# Patient Record
Sex: Female | Born: 1980 | Hispanic: Yes | Marital: Single | Smoking: Never smoker
Health system: Southern US, Community
[De-identification: ages and names within clinical notes are randomized; demographics above are authoritative.]

## PROBLEM LIST (undated history)

## (undated) DIAGNOSIS — D249 Benign neoplasm of unspecified breast: Secondary | ICD-10-CM

## (undated) DIAGNOSIS — N63 Unspecified lump in unspecified breast: Secondary | ICD-10-CM

## (undated) DIAGNOSIS — N6009 Solitary cyst of unspecified breast: Secondary | ICD-10-CM

## (undated) HISTORY — DX: Unspecified lump in unspecified breast: N63.0

## (undated) HISTORY — DX: Benign neoplasm of unspecified breast: D24.9

## (undated) HISTORY — DX: Solitary cyst of unspecified breast: N60.09

---

## 2007-03-03 ENCOUNTER — Ambulatory Visit: Payer: Self-pay | Admitting: Family Medicine

## 2007-07-31 ENCOUNTER — Inpatient Hospital Stay: Payer: Self-pay | Admitting: Obstetrics and Gynecology

## 2012-03-19 ENCOUNTER — Ambulatory Visit: Payer: Self-pay | Admitting: Family Medicine

## 2012-04-01 ENCOUNTER — Ambulatory Visit: Payer: Self-pay | Admitting: Family Medicine

## 2012-04-12 ENCOUNTER — Ambulatory Visit: Payer: Self-pay | Admitting: Family Medicine

## 2012-04-14 DIAGNOSIS — N63 Unspecified lump in unspecified breast: Secondary | ICD-10-CM

## 2012-04-14 DIAGNOSIS — D249 Benign neoplasm of unspecified breast: Secondary | ICD-10-CM

## 2012-04-14 DIAGNOSIS — N6009 Solitary cyst of unspecified breast: Secondary | ICD-10-CM

## 2012-04-14 HISTORY — DX: Solitary cyst of unspecified breast: N60.09

## 2012-04-14 HISTORY — DX: Benign neoplasm of unspecified breast: D24.9

## 2012-04-14 HISTORY — PX: BREAST BIOPSY: SHX20

## 2012-04-14 HISTORY — DX: Unspecified lump in unspecified breast: N63.0

## 2012-04-20 ENCOUNTER — Ambulatory Visit: Payer: Self-pay

## 2012-04-29 HISTORY — PX: BREAST SURGERY: SHX581

## 2012-08-24 ENCOUNTER — Encounter: Payer: Self-pay | Admitting: *Deleted

## 2012-08-24 DIAGNOSIS — D249 Benign neoplasm of unspecified breast: Secondary | ICD-10-CM | POA: Insufficient documentation

## 2012-11-16 ENCOUNTER — Ambulatory Visit (INDEPENDENT_AMBULATORY_CARE_PROVIDER_SITE_OTHER): Payer: PRIVATE HEALTH INSURANCE | Admitting: General Surgery

## 2012-11-16 ENCOUNTER — Other Ambulatory Visit: Payer: Self-pay

## 2012-11-16 ENCOUNTER — Encounter: Payer: Self-pay | Admitting: General Surgery

## 2012-11-16 VITALS — BP 98/72 | HR 72 | Resp 14 | Ht <= 58 in | Wt 131.0 lb

## 2012-11-16 DIAGNOSIS — N63 Unspecified lump in unspecified breast: Secondary | ICD-10-CM

## 2012-11-16 DIAGNOSIS — D249 Benign neoplasm of unspecified breast: Secondary | ICD-10-CM

## 2012-11-16 NOTE — Patient Instructions (Addendum)
Auto Examen [employer] (Breast Self-Examination) Deber comenzar con el examen de sus mamas a los 20 aos incluso cuando el riesgo de cncer es bajo a esa edad. Es importante familiarizarse acerca de cmo sus pechos se ven y se sienten. Esto tambin es necesario en mujeres embarazadas, en etapa de amamantamiento, con menopausia o con implantes.  Las mujeres deben examinar sus pechos una vez al mes para observar cambios y bultos. Al realizar los [employer], se llega a Microbiologist en que sus senos se sienten y [place] de un mes a Therapist, art. Esto le permite descubrir cambios de manera precoz. Este autoexamen [employer] ofrece la tranquilidad de que sus senos estn en buen [place]. Este examen slo le tomar algunos minutos y Health and safety inspector aos a su vida. La mayor parte de los bultos en la mama no son cancerosos. Si encuentra un bulto, un tipo de radiografas llamado mamografa u otras pruebas sern necesarias para determinar qu es lo que est mal.  Algunos de los sntomas de que un bulto en el pecho est causado por un cncer son:  Hundimiento de la piel o cambios en la forma del pecho o pezn.   Supuracin oscura o sanguinolenta del pezn.   Ganglios linfticos inflamados alrededor del pecho o en la axila.   Enrojecimiento de la mama o pezn.   Descamacin del pezn o la piel del pecho.   Dolor o inflamacin de la mama.  AUTOEXAMEN  Hay algunas indicaciones que debe seguir para Charity fundraiser minucioso. El mejor momento para examinar las mamas es 5 a 7 das despus que ha finalizado el perodo menstrual. Durante los aos en que se [place], es mejor examinar las mamas uno o [facility] despus del perodo menstrual. Durante la menstruacin, las mamas estn ms abultadas y puede haber ms dificultad para Clinical research associate modificaciones. Si no menstra, se encuentra en la menopausia o ha sufrido una histerectoma, Orthoptist de cada mes. Luego de tres o cuatro meses se  familiarizar con las variaciones y se sentir ms cmoda para Horticulturist, commercial.  Realice un examen mensualmente. Debe llevar un registro escrito con los cambios o los hallazgos normales que encuentre para cada seno. Mudlogger segura de los cambios y no depender slo de la memoria para el [place], las molestias o la ubicacin de bultos. Trate de Horticulturist, commercial en el mismo momento cada mes y registrar cuando tiene su perodo menstrual, si an lo tiene.   Observe sus mamas. Prese frente a un espejo con las manos tomadas detrs de la [place]. Tense los msculos del pecho y busque asimetras. Esto significa diferencias en la forma o en el contorno de un seno al otro, tales como arrugas, hoyuelos o bultos. Tambin busque cambios en la piel.   Reclnese hacia delante con sus manos sobre las caderas Una vez ms busque asimetras y cambios en la piel.   Mientras se ducha, jabnese y palpe cuidadosamente los senos con la yema de los dedos mientras sostiene el brazo (del lado del seno que es examinado) sobre la cabeza. Hgalo con cada seno cuidadosamente, buscando bultos o modificaciones. Generalmente deber emplear un movimiento circular con Lacretia N[place] presin de los dedos.   Repita este examen mientras est recostada sobre la [place], una vez ms con el brazo sobre la cabeza y una almohada bajo los hombros. [place] use las yemas de los dedos para examinar ambas mamas, buscando bultos y engrosamientos. Comience  en la zona de la hora 1 y contine en el sentido de las agujas del reloj por toda la mama.   Al terminar el examen, pellizque suavemente cada pezn para ver si hay secrecin. Observe cambios en los pezones, hundimientos o enrojecimiento.   Finalmente examine la parte superior del trax, la zona de las clavculas y [place].  No es necesario alarmarse si encuentra un bulto. La mayor parte son benignos (no cancerosos). Pero ser necesario consultar con el profesional que la  asiste para que pueda evaluarlo. Document Released: 03/31/2005 Document Revised: 12/11/2010 [facility] Patient Information 2012 [place], [place].

## 2012-11-16 NOTE — Progress Notes (Signed)
Patient ID: Christina Conrad, female   DOB: 1980-05-23, 32 y.o.   MRN: [id number]  Chief Complaint  Patient presents with  . Other    breast    HPI Christina Conrad is a 32 y.o. female. who presents for a follow up breast evaluation and ultrasound. 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 Patient does perform regular self breast checks. Occasional pain right breast prior to monthly periods. No new breast issues.  Interpreter present for exam.    HPI  Past Medical History  Diagnosis Date  . 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 Lump or mass in breast 2014  . Solitary cyst of breast 2014    Past Surgical History  Procedure Laterality Date  . Cesarean section  2009  . Breast surgery Bilateral 1.16.2014    Bard core bx done left breast 2 o'clock, right breast 1100 and 2 o'clock. fibroadenoma and PASH    Family History  Problem Relation Age of Onset  . Breast cancer Paternal Grandmother   . Breast cancer Paternal Grandmother     great grandmother    Social History History  Substance Use Topics  . Smoking status: Never Smoker   . Smokeless tobacco: Never Used  . Alcohol Use: No    Allergies  Allergen Reactions  . Shellfish Allergy Itching    Current Outpatient Prescriptions  Medication Sig Dispense Refill  . acetaminophen (TYLENOL) 500 MG tablet Take 500 mg by mouth as needed for pain.       No current facility-administered medications for this visit.    Review of Systems Review of Systems  Blood pressure 98/72, pulse 72, resp. rate 14, height 4\' 10"  (1.473 m), weight 131 lb (59.421 kg), last menstrual period 11/12/2012.  Physical Exam Physical Exam  Constitutional: She is oriented to person, place, and time. She appears well-developed and well-nourished.  Eyes: Conjunctivae are normal.  Neck: Neck supple. No thyromegaly present.  Pulmonary/Chest: Right breast exhibits  no inverted nipple, no mass, no nipple discharge, no skin change and no tenderness. Left breast exhibits no inverted nipple, no mass, no nipple discharge, no skin change and no tenderness.  Lymphadenopathy:    She has no cervical adenopathy.  Neurological: She is alert and oriented to person, place, and time.  Skin: Skin is warm and dry.    Data Reviewed Previous ultrasound.  Assessment    By ultrasound today the left breast mass and right breast mass at 2 o'clock are not seen.  The mass in the right breast at 11 o'clock appears stable.    Plan    Follow up on an as needed.  Continue self breast exams.    Kazia Grisanti G 11/16/2012, 9:09 PM

## 2013-01-13 ENCOUNTER — Emergency Department: Payer: Self-pay | Admitting: Emergency Medicine

## 2014-02-13 ENCOUNTER — Encounter: Payer: Self-pay | Admitting: General Surgery

## 2015-08-13 ENCOUNTER — Ambulatory Visit
Admission: RE | Admit: 2015-08-13 | Discharge: 2015-08-13 | Disposition: A | Discharge status: Home / Self Care | Payer: Self-pay | Source: Ambulatory Visit | Attending: Oncology | Admitting: Oncology

## 2015-08-13 ENCOUNTER — Ambulatory Visit: Payer: Self-pay | Attending: Oncology

## 2015-08-13 VITALS — BP 110/73 | HR 81 | Temp 97.1°F | Resp 16

## 2015-08-13 DIAGNOSIS — N644 Mastodynia: Secondary | ICD-10-CM

## 2015-08-13 DIAGNOSIS — N63 Unspecified lump in unspecified breast: Secondary | ICD-10-CM

## 2015-08-13 NOTE — Progress Notes (Signed)
Subjective:     Patient ID: Christina Conrad, female   DOB: 05-25-1980, 35 y.o.   MRN: [id number]  HPI   Review of Systems     Objective:   Physical Exam  Pulmonary/Chest: Right breast exhibits tenderness. Right breast exhibits no inverted nipple, no mass, no nipple discharge and no skin change. Left breast exhibits no inverted nipple, no mass, no nipple discharge, no skin change and no tenderness. Breasts are symmetrical.         Assessment:  35 year old  patient presents for [place] clinic visit. Patient screened, and meets BCCCP eligibility.  Patient does not have insurance, Medicare or Medicaid.  Handout given on Affordable Care Act. Instructed patient on breast self-exam using teach back method.  Patient complains of targeted right breast pain at 10 o'clock.  She saw Dr. Jamal Collin in 2013, and had biopsy of same area.  Results were benign with fibroadenomatous results.   Adin Hector interpreted exam.    Plan:    Sent for bilateral diagnotic mammogram, and ultrasound.

## 2015-08-22 NOTE — Progress Notes (Signed)
Mammogram results Birads 3 with two probable benign areas in right breast.  Spoke to patient, and she would prefer to follow-up with Dr. Jamal Collin rather than wait 6 months. Scheduled consult with Dr. Jamal Collin for 08/27/15 at 4:15 with [place]lyn[place] at [place] interpreted call.  Office to schedule interpreter for appointment.

## 2015-08-27 ENCOUNTER — Ambulatory Visit (INDEPENDENT_AMBULATORY_CARE_PROVIDER_SITE_OTHER): Payer: PRIVATE HEALTH INSURANCE | Admitting: General Surgery

## 2015-08-27 ENCOUNTER — Encounter: Payer: Self-pay | Admitting: General Surgery

## 2015-08-27 VITALS — BP 120/80 | HR 82 | Resp 14 | Wt 122.0 lb

## 2015-08-27 DIAGNOSIS — N644 Mastodynia: Secondary | ICD-10-CM | POA: Diagnosis not present

## 2015-08-27 DIAGNOSIS — D242 Benign neoplasm of left breast: Secondary | ICD-10-CM | POA: Diagnosis not present

## 2015-08-27 DIAGNOSIS — D241 Benign neoplasm of right breast: Secondary | ICD-10-CM | POA: Diagnosis not present

## 2015-08-27 NOTE — Patient Instructions (Signed)
Use Ibuprofen or Aleve daily for the next two weeks.    Fibroadenoma (Fibroadenoma) Un fibroadenoma es un bulto (tumor) en la mama que no es canceroso (es benigno). Es posible que, al tocarlo, se mueva debajo de la piel. Este tipo de bulto puede crecer [employer] o en ambas mamas. CUIDADOS EN EL HOGAR  Si le extirparon un bulto, siga las indicaciones del mdico en lo que respecta a los cuidados en el hogar despus del procedimiento.  Contrlese las [employer] en su casa como se lo haya indicado el mdico.  Concurra a todas las visitas de control como se lo haya indicado el mdico. Esto es importante. SOLICITE AYUDA SI:  El bulto cambia de tamao o se siente diferente al tacto.  El bulto empieza a Education administrator.  Encuentra un bulto nuevo.  Nota cualquier cambio en la piel de la mama.  Nota cualquier cambio en el pezn.  Le sale lquido del pezn.   Esta informacin no tiene Marine scientist el consejo del mdico. Asegrese de hacerle al mdico cualquier pregunta que tenga.   Document Released: 07/05/2010 Document Revised: 12/20/2014 Elsevier Interactive Patient Education [employer].

## 2015-08-27 NOTE — Progress Notes (Signed)
Patient ID: Christina Conrad, female   DOB: [birth year], 35 y.o.   MRN: [id number]  Chief Complaint  Patient presents with  . Other    mammogram    HPI Christina Conrad is a 34 y.o. female here for assessment of a right breast mass. Her most recent mammogram was 08/13/15. She is here today with interpreter Gala Murdoch. She reports that she has some pain with palpation of the right breast, she reports feeling a hard area. She states that this pain started about a month ago.  I have reviewed the history of present illness with the patient. HPI  Past Medical History  Diagnosis Date  . 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 Lump or mass in breast 2014  . Solitary cyst of breast 2014    Past Surgical History  Procedure Laterality Date  . Cesarean section  2009  . Breast surgery Bilateral 1.16.2014    Bard core bx done left breast 2 o'clock, right breast 1100 and 2 o'clock. fibroadenoma and PASH  . Breast biopsy Bilateral 04/2012    fibroadenomas-done in Dr. Angie Fava office.    Family History  Problem Relation Age of Onset  . Breast cancer Paternal Grandmother   . Breast cancer Paternal Grandmother     great grandmother    Social History Social History  Substance Use Topics  . Smoking status: Never Smoker   . Smokeless tobacco: Never Used  . Alcohol Use: No    Allergies  Allergen Reactions  . Shellfish Allergy Itching    No current outpatient prescriptions on file.   No current facility-administered medications for this visit.    Review of Systems Review of Systems  Constitutional: Negative.   Respiratory: Negative.   Cardiovascular: Negative.     Blood pressure 120/80, pulse 82, resp. rate 14, weight 122 lb (55.339 kg), last menstrual period 08/18/2015.  Physical Exam Physical Exam  Constitutional: She is oriented to person, place, and time. She appears well-developed and well-nourished.  Eyes: Conjunctivae are  normal. No scleral icterus.  Neck: Neck supple.  Cardiovascular: Normal rate, regular rhythm and normal heart sounds.   Pulmonary/Chest: Effort normal and breath sounds normal. Right breast exhibits no inverted nipple, no mass, no nipple discharge, no skin change and no tenderness. Left breast exhibits no inverted nipple, no mass, no nipple discharge, no skin change and no tenderness.  Abdominal: Soft. Bowel sounds are normal.  Lymphadenopathy:    She has no cervical adenopathy.    She has no axillary adenopathy.  Neurological: She is alert and oriented to person, place, and time.  Skin: Skin is warm and dry.  Psychiatric: She has a normal mood and affect.    Data Reviewed Prior notes In 2014 she had bilateral core biopsies showing fibroadenoma and PASH.  Current imaging shows a stable tiny nodule right breast and a second one adjacent area.  Assessment    Mastalgia. Likely not caused by the tiny lumps in her breast.      Plan    Recommend use of Ibuprofen or Aleve for 1 week. Reassess in 2 mos.. Via interpreter all of the above explained to pt.     PCP: [place] This has been scribed by Lesly Rubenstein LPN    Christene Lye 08/27/2015, 5:12 PM

## 2015-10-07 NOTE — Progress Notes (Signed)
Patient followed up with Dr. Jamal Collin.  She is to follow up with him  in two months for mastalgia recommendations.  Copy to [facility].

## 2015-11-05 ENCOUNTER — Ambulatory Visit: Payer: PRIVATE HEALTH INSURANCE | Admitting: General Surgery

## 2015-12-11 ENCOUNTER — Encounter: Payer: Self-pay | Admitting: *Deleted

## 2018-04-04 DIAGNOSIS — G521 Disorders of glossopharyngeal nerve: Secondary | ICD-10-CM | POA: Insufficient documentation

## 2019-07-09 ENCOUNTER — Ambulatory Visit: Payer: Self-pay | Attending: Internal Medicine

## 2019-07-09 DIAGNOSIS — Z23 Encounter for immunization: Secondary | ICD-10-CM

## 2019-07-09 NOTE — Progress Notes (Signed)
   Covid-19 Vaccination Clinic  Name:  Christina Conrad    MRN: [id number] DOB: 26-Oct-1980  07/09/2019  Ms. Guillermo Mccasland was observed post Covid-19 immunization for 15 minutes without incident. She was provided with Vaccine Information Sheet and instruction to access the V-Safe system.   Ms. Vetta Loven was instructed to call 911 with any severe reactions post vaccine: Marland Kitchen Difficulty breathing  . Swelling of face and throat  . A fast heartbeat  . A bad rash all over body  . Dizziness and weakness   Immunizations Administered    Name Date Dose VIS Date Route   Pfizer COVID-19 Vaccine 07/09/2019  4:33 PM 0.3 mL 03/25/2019 Intramuscular   Manufacturer: [place]   Lot: [id number]   [place]: [id number]

## 2019-07-30 ENCOUNTER — Ambulatory Visit: Payer: Self-pay | Attending: Internal Medicine

## 2019-07-30 DIAGNOSIS — Z23 Encounter for immunization: Secondary | ICD-10-CM

## 2019-07-30 NOTE — Progress Notes (Signed)
   Covid-19 Vaccination Clinic  Name:  Christina Conrad    MRN: [id number] DOB: 1980-12-19  07/30/2019  [name] was observed post Covid-19 immunization for 15 minutes without incident. She was provided with Vaccine Information Sheet and instruction to access the V-Safe system.   [name] was instructed to call 911 with any severe reactions post vaccine: Marland Kitchen Difficulty breathing  . Swelling of face and throat  . A fast heartbeat  . A bad rash all over body  . Dizziness and weakness   Immunizations Administered    Name Date Dose VIS Date Route   Pfizer COVID-19 Vaccine 07/30/2019  4:42 PM 0.3 mL 03/25/2019 Intramuscular   Manufacturer: [place]   Lot: [id number]   [place]: [id number]

## 2019-11-22 ENCOUNTER — Other Ambulatory Visit: Payer: Self-pay

## 2019-11-22 ENCOUNTER — Ambulatory Visit
Admission: RE | Admit: 2019-11-22 | Discharge: 2019-11-22 | Disposition: A | Discharge status: Home / Self Care | Payer: Self-pay | Source: Ambulatory Visit | Attending: Family Medicine | Admitting: Family Medicine

## 2019-11-22 ENCOUNTER — Other Ambulatory Visit: Payer: Self-pay | Admitting: Family Medicine

## 2019-11-22 DIAGNOSIS — B349 Viral infection, unspecified: Secondary | ICD-10-CM

## 2021-09-06 ENCOUNTER — Other Ambulatory Visit: Payer: Self-pay

## 2021-09-06 DIAGNOSIS — N6311 Unspecified lump in the right breast, upper outer quadrant: Secondary | ICD-10-CM

## 2021-09-11 ENCOUNTER — Ambulatory Visit
Admission: RE | Admit: 2021-09-11 | Discharge: 2021-09-11 | Disposition: A | Discharge status: Home / Self Care | Payer: Self-pay | Source: Ambulatory Visit | Attending: Obstetrics and Gynecology | Admitting: Obstetrics and Gynecology

## 2021-09-11 ENCOUNTER — Ambulatory Visit: Payer: Self-pay | Attending: Hematology and Oncology | Admitting: *Deleted

## 2021-09-11 ENCOUNTER — Encounter (INDEPENDENT_AMBULATORY_CARE_PROVIDER_SITE_OTHER): Payer: Self-pay

## 2021-09-11 DIAGNOSIS — N6311 Unspecified lump in the right breast, upper outer quadrant: Secondary | ICD-10-CM | POA: Insufficient documentation

## 2021-09-11 DIAGNOSIS — Z1239 Encounter for other screening for malignant neoplasm of breast: Secondary | ICD-10-CM

## 2021-09-11 NOTE — Patient Instructions (Signed)
Explained breast self awareness with Bary Richard. Patient did not need a Pap smear today due to last Pap smear was one year ago per patient. Let her know BCCCP will cover Pap smears every 3 years unless has a history of abnormal Pap smears. Referred patient to the [facility] for a diagnostic mammogram per recommendation. Appointment scheduled Wednesday, Sep 11, 2021 at 1340. Patient aware of appointment and will be there. [place] verbalized understanding.  Darrel Baroni, Arvil Chaco, RN 1:42 PM

## 2021-09-11 NOTE — Progress Notes (Signed)
[name] is a 41 y.o. female who presents to [facility] clinic today with complaint of left outer breast pain x 5-6 months that comes and goes. Patient states the pain increases with activity that includes walking. Patient rates the pain at a 7 out of 10. Patient had a diagnostic mammogram and right breast ultrasound completed 08/13/2015 that 75-monthdiagnostic mammogram and ultrasound was recommended for follow up that was not completed.    Pap Smear: Pap smear not completed today. [facility]. Per patient has no history of an abnormal Pap smear. Last Pap smear result is not available in Epic. Patients previous Pap smear 09/13/2014 is available in Epic.   Physical exam: Breasts Breasts symmetrical. No skin abnormalities bilateral breasts. No nipple retraction bilateral breasts. No nipple discharge bilateral breasts. No lymphadenopathy. No lumps palpated bilateral breasts. No complaints of pain or tenderness on exam.   Pelvic/Bimanual Pap is not indicated today per BCCCP guidelines.   Smoking History: Patient has never smoked.   Patient Navigation: Patient education provided. Access to services provided for patient through BStarbucks Corporationprogram. Spanish interpreter MAlean Rinnefrom [facility].    Breast and Cervical Cancer Risk Assessment: Patient has family history of her paternal grandmother having breast cancer. Patient has no known genetic mutations or history of radiation treatment to the chest before age 41 Patient does not have history of cervical dysplasia, immunocompromised, or DES exposure in-utero.  Risk Assessment     Risk Scores       09/11/2021   Last edited by: EDrue Dun RN   5-year risk: 0.6 %   Lifetime risk: 6.7 %            A: BCCCP exam without pap smear Complaint of left outer breast pain.  [facility]. Appointment scheduled Wednesday, Sep 11, 2021 at 1340.  BLoletta Parish RN 09/11/2021 1:42 PM

## 2022-06-02 ENCOUNTER — Other Ambulatory Visit: Payer: Self-pay

## 2022-06-02 ENCOUNTER — Emergency Department: Payer: Self-pay

## 2022-06-02 ENCOUNTER — Emergency Department
Admission: EM | Admit: 2022-06-02 | Discharge: 2022-06-02 | Disposition: A | Discharge status: Home / Self Care | Payer: Self-pay | Attending: Emergency Medicine | Admitting: Emergency Medicine

## 2022-06-02 DIAGNOSIS — Z853 Personal history of malignant neoplasm of breast: Secondary | ICD-10-CM | POA: Insufficient documentation

## 2022-06-02 DIAGNOSIS — R1310 Dysphagia, unspecified: Secondary | ICD-10-CM | POA: Insufficient documentation

## 2022-06-02 DIAGNOSIS — R1013 Epigastric pain: Secondary | ICD-10-CM | POA: Insufficient documentation

## 2022-06-02 DIAGNOSIS — R079 Chest pain, unspecified: Secondary | ICD-10-CM | POA: Insufficient documentation

## 2022-06-02 LAB — HEPATIC FUNCTION PANEL
ALT: 17 U/L (ref 0–44)
AST: 33 U/L (ref 15–41)
Albumin: 4.9 g/dL (ref 3.5–5.0)
Alkaline Phosphatase: 77 U/L (ref 38–126)
Bilirubin, Direct: 0.3 mg/dL — ABNORMAL HIGH (ref 0.0–0.2)
Indirect Bilirubin: 1.1 mg/dL — ABNORMAL HIGH (ref 0.3–0.9)
Total Bilirubin: 1.4 mg/dL — ABNORMAL HIGH (ref 0.3–1.2)
Total Protein: 8.7 g/dL — ABNORMAL HIGH (ref 6.5–8.1)

## 2022-06-02 LAB — CBC
HCT: 33.5 % — ABNORMAL LOW (ref 36.0–46.0)
Hemoglobin: 10.6 g/dL — ABNORMAL LOW (ref 12.0–15.0)
MCH: 24.9 pg — ABNORMAL LOW (ref 26.0–34.0)
MCHC: 31.6 g/dL (ref 30.0–36.0)
MCV: 78.6 fL — ABNORMAL LOW (ref 80.0–100.0)
Platelets: 238 10*3/uL (ref 150–400)
RBC: 4.26 MIL/uL (ref 3.87–5.11)
RDW: 14.3 % (ref 11.5–15.5)
WBC: 6.1 10*3/uL (ref 4.0–10.5)
nRBC: 0 % (ref 0.0–0.2)

## 2022-06-02 LAB — BASIC METABOLIC PANEL
Anion gap: 6 (ref 5–15)
BUN: 10 mg/dL (ref 6–20)
CO2: 26 mmol/L (ref 22–32)
Calcium: 8.6 mg/dL — ABNORMAL LOW (ref 8.9–10.3)
Chloride: 105 mmol/L (ref 98–111)
Creatinine, Ser: 0.6 mg/dL (ref 0.44–1.00)
GFR, Estimated: >60 mL/min (ref >60)
Glucose, Bld: 108 mg/dL — ABNORMAL HIGH (ref 70–99)
Potassium: 3.5 mmol/L (ref 3.5–5.1)
Sodium: 137 mmol/L (ref 135–145)

## 2022-06-02 LAB — LIPASE, BLOOD: Lipase: 33 U/L (ref 11–51)

## 2022-06-02 LAB — TROPONIN I (HIGH SENSITIVITY): Troponin I (High Sensitivity): <2 ng/L (ref <18)

## 2022-06-02 MED ORDER — IOHEXOL 300 MG/ML  SOLN
100.0000 mL | Freq: Once | INTRAMUSCULAR | Status: AC | PRN
  Administered 2022-06-02: 100 mL via INTRAVENOUS

## 2022-06-02 NOTE — ED Triage Notes (Signed)
Brought over by [facility]. C/o epigastric pain after eating. Reports emesis often after eating. Has scope appointment in May

## 2022-06-02 NOTE — ED Triage Notes (Signed)
Pt. To ED via POV for epigastric pain and difficulty eating x1 month, increasing in severity the past several days. Pt. Has appt. With GI for May.

## 2022-06-02 NOTE — ED Provider Notes (Signed)
[facility] Provider Note    Event Date/Time   First MD Initiated Contact with Patient 06/02/22 1941     (approximate)   History   Abdominal Pain (Pt. To ED via POV for epigastric pain and difficulty eating x1 month, increasing in severity the past several days. Pt. Has appt. With GI for May. )   HPI {Remember to add pertinent medical, surgical, social, and/or OB history to HPI:1} Christina Conrad is a 42 y.o. female  ***       Physical Exam   Triage Vital Signs: ED Triage Vitals  Enc Vitals Group     BP 06/02/22 1725 122/80     Pulse Rate 06/02/22 1725 85     Resp 06/02/22 1725 18     Temp 06/02/22 1725 98.1 F (36.7 C)     Temp Source 06/02/22 1725 Oral     SpO2 06/02/22 1725 100 %     Weight 06/02/22 1727 136 lb (61.7 kg)     Height 06/02/22 1727 4' 10"$  (1.473 m)     Head Circumference --      Peak Flow --      Pain Score 06/02/22 1726 10     Pain Loc --      Pain Edu? --      Excl. in [place]? --     Most recent vital signs: Vitals:   06/02/22 1725  BP: 122/80  Pulse: 85  Resp: 18  Temp: 98.1 F (36.7 C)  SpO2: 100%    {Only need to document appropriate and relevant physical exam:1} General: Awake, no distress. *** CV:  Good peripheral perfusion. *** Resp:  Normal effort. *** Abd:  No distention. *** Other:  ***   ED Results / Procedures / Treatments   Labs (all labs ordered are listed, but only abnormal results are displayed) Labs Reviewed  BASIC METABOLIC PANEL - Abnormal; Notable for the following components:      Result Value   Glucose, Bld 108 (*)    Calcium 8.6 (*)    All other components within normal limits  CBC - Abnormal; Notable for the following components:   Hemoglobin 10.6 (*)    HCT 33.5 (*)    MCV 78.6 (*)    MCH 24.9 (*)    All other components within normal limits  HEPATIC FUNCTION PANEL - Abnormal; Notable for the following components:   Total Protein 8.7 (*)    Total Bilirubin 1.4 (*)     Bilirubin, Direct 0.3 (*)    Indirect Bilirubin 1.1 (*)    All other components within normal limits  LIPASE, BLOOD  POC URINE PREG, ED  TROPONIN I (HIGH SENSITIVITY)     EKG  ***   RADIOLOGY *** {USE THE WORD "INTERPRETED"!! You MUST document your own interpretation of imaging, as well as the fact that you reviewed the radiologist's report!:1}   PROCEDURES:  Critical Care performed: {CriticalCareYesNo:19197::"Yes, see critical care procedure note(s)","No"}  Procedures   MEDICATIONS ORDERED IN ED: Medications  iohexol (OMNIPAQUE) 300 MG/ML solution 100 mL (100 mLs Intravenous Contrast Given 06/02/22 2150)     IMPRESSION / MDM / [place] / ED COURSE  I reviewed the triage vital signs and the nursing notes.                              Differential diagnosis includes, but is not limited to, ***  Patient's  presentation is most consistent with {EM COPA:27473}  *** {If the patient is on the monitor, remove the brackets and asterisks on the sentence below and remember to document it as a Procedure as well. Otherwise delete the sentence below:1} {**The patient is on the cardiac monitor to evaluate for evidence of arrhythmia and/or significant heart rate changes.**} {Remember to include, when applicable, any/all of the following data: independent review of imaging independent review of labs (comment specifically on pertinent positives and negatives) review of specific prior hospitalizations, PCP/specialist notes, etc. discuss meds given and prescribed document any discussion with consultants (including hospitalists) any clinical decision tools you used and why (PECARN, NEXUS, etc.) did you consider admitting the patient? document social determinants of health affecting patient's care (homelessness, inability to follow up in a timely fashion, etc) document any pre-existing conditions increasing risk on current visit (e.g. diabetes and HTN increasing danger of  high-risk chest pain/ACS) describes what meds you gave (especially parenteral) and why any other interventions?:1}     FINAL CLINICAL IMPRESSION(S) / ED DIAGNOSES   Final diagnoses:  None     Rx / DC Orders   ED Discharge Orders     None        Note:  This document was prepared using Dragon voice recognition software and may include unintentional dictation errors.

## 2022-06-03 NOTE — ED Provider Notes (Incomplete)
[facility] Provider Note    Event Date/Time   First MD Initiated Contact with Patient 06/02/22 1941     (approximate)   History      HPI  HPI obtained via the patient's family over interpreting per her request.  Christina Conrad is a 42 y.o. female with a history of a benign neoplasm of the breast      Physical Exam   Triage Vital Signs: ED Triage Vitals  Enc Vitals Group     BP 06/02/22 1725 122/80     Pulse Rate 06/02/22 1725 85     Resp 06/02/22 1725 18     Temp 06/02/22 1725 98.1 F (36.7 C)     Temp Source 06/02/22 1725 Oral     SpO2 06/02/22 1725 100 %     Weight 06/02/22 1727 136 lb (61.7 kg)     Height 06/02/22 1727 4' 10"$  (1.473 m)     Head Circumference --      Peak Flow --      Pain Score 06/02/22 1726 10     Pain Loc --      Pain Edu? --      Excl. in [place]? --     Most recent vital signs: Vitals:   06/02/22 1725  BP: 122/80  Pulse: 85  Resp: 18  Temp: 98.1 F (36.7 C)  SpO2: 100%    {Only need to document appropriate and relevant physical exam:1} General: Awake, no distress. *** CV:  Good peripheral perfusion. *** Resp:  Normal effort. *** Abd:  No distention. *** Other:  ***   ED Results / Procedures / Treatments   Labs (all labs ordered are listed, but only abnormal results are displayed) Labs Reviewed  BASIC METABOLIC PANEL - Abnormal; Notable for the following components:      Result Value   Glucose, Bld 108 (*)    Calcium 8.6 (*)    All other components within normal limits  CBC - Abnormal; Notable for the following components:   Hemoglobin 10.6 (*)    HCT 33.5 (*)    MCV 78.6 (*)    MCH 24.9 (*)    All other components within normal limits  HEPATIC FUNCTION PANEL - Abnormal; Notable for the following components:   Total Protein 8.7 (*)    Total Bilirubin 1.4 (*)    Bilirubin, Direct 0.3 (*)    Indirect Bilirubin 1.1 (*)    All other components within normal limits  LIPASE, BLOOD  POC  URINE PREG, ED  TROPONIN I (HIGH SENSITIVITY)     EKG  ***   RADIOLOGY *** {USE THE WORD "INTERPRETED"!! You MUST document your own interpretation of imaging, as well as the fact that you reviewed the radiologist's report!:1}   PROCEDURES:  Critical Care performed: {CriticalCareYesNo:19197::"Yes, see critical care procedure note(s)","No"}  Procedures   MEDICATIONS ORDERED IN ED: Medications  iohexol (OMNIPAQUE) 300 MG/ML solution 100 mL (100 mLs Intravenous Contrast Given 06/02/22 2150)     IMPRESSION / MDM / [place] / ED COURSE  I reviewed the triage vital signs and the nursing notes.                              Differential diagnosis includes, but is not limited to, ***  Patient's presentation is most consistent with {EM COPA:27473}  *** {If the patient is on the monitor, remove the brackets and asterisks  on the sentence below and remember to document it as a Procedure as well. Otherwise delete the sentence below:1} {**The patient is on the cardiac monitor to evaluate for evidence of arrhythmia and/or significant heart rate changes.**} {Remember to include, when applicable, any/all of the following data: independent review of imaging independent review of labs (comment specifically on pertinent positives and negatives) review of specific prior hospitalizations, PCP/specialist notes, etc. discuss meds given and prescribed document any discussion with consultants (including hospitalists) any clinical decision tools you used and why (PECARN, NEXUS, etc.) did you consider admitting the patient? document social determinants of health affecting patient's care (homelessness, inability to follow up in a timely fashion, etc) document any pre-existing conditions increasing risk on current visit (e.g. diabetes and HTN increasing danger of high-risk chest pain/ACS) describes what meds you gave (especially parenteral) and why any other interventions?:1}      FINAL CLINICAL IMPRESSION(S) / ED DIAGNOSES   Final diagnoses:  None     Rx / DC Orders   ED Discharge Orders     None        Note:  This document was prepared using Dragon voice recognition software and may include unintentional dictation errors.

## 2022-11-16 IMAGING — MG DIGITAL DIAGNOSTIC BILAT W/ TOMO W/ CAD
8 of 14 series · 8 of 40 positions shown · non-contrast
Comparison: Previous exam(s).

CLINICAL DATA: Here for follow-up of probably benign masses in the
right breast as well as annual mammogram of the left breast.

EXAM:
DIGITAL DIAGNOSTIC BILATERAL MAMMOGRAM WITH TOMOSYNTHESIS AND CAD;
ULTRASOUND RIGHT BREAST LIMITED
TECHNIQUE: Bilateral digital diagnostic mammography and breast tomosynthesis
was performed. The images were evaluated with computer-aided
detection.; Targeted ultrasound examination of the right breast was
performed

[L MLO synth-2D]
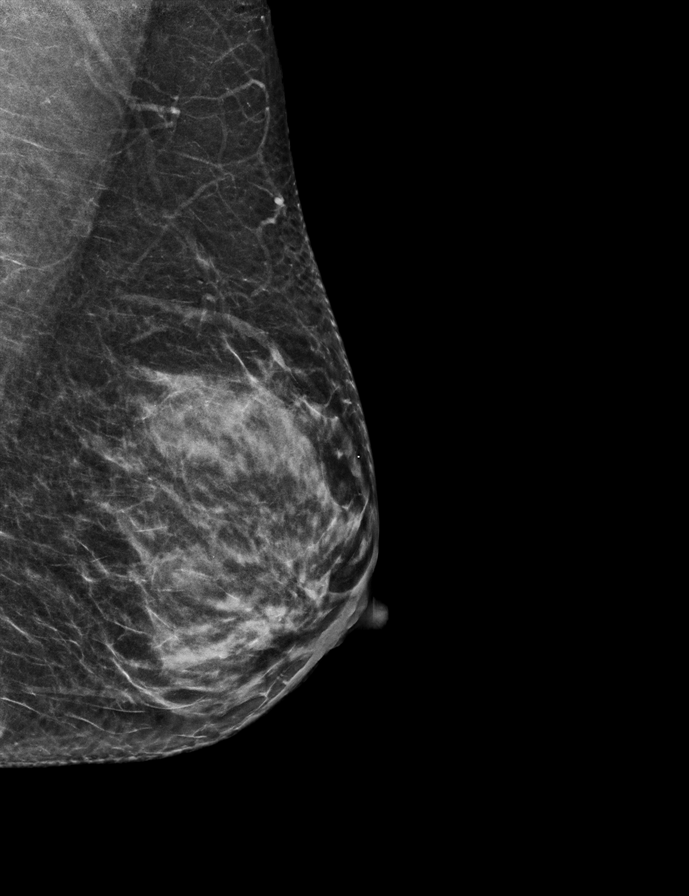

[R MLO synth-2D (1 of 2)]
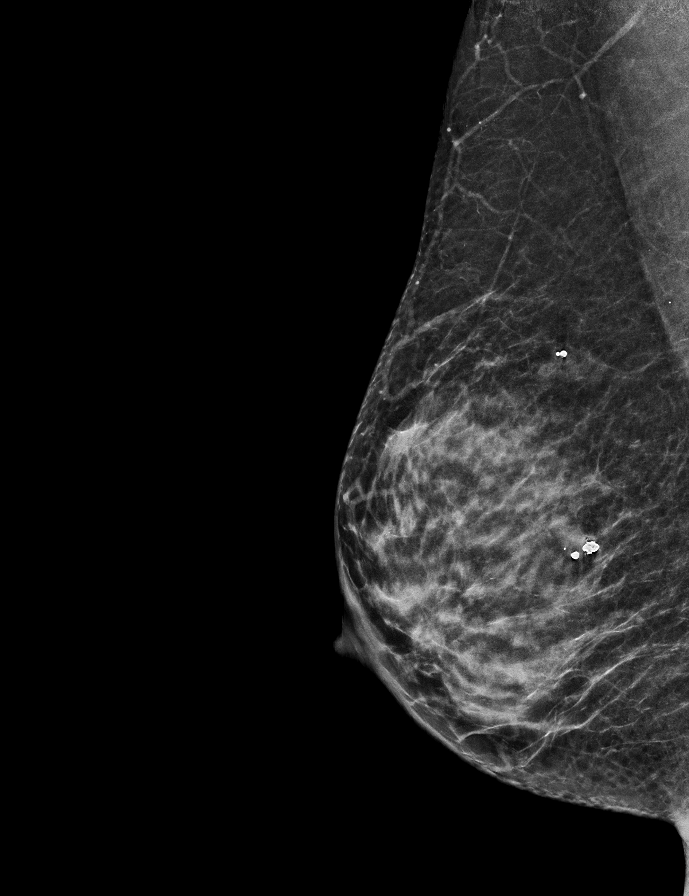

[R MLO synth-2D (2 of 2)]
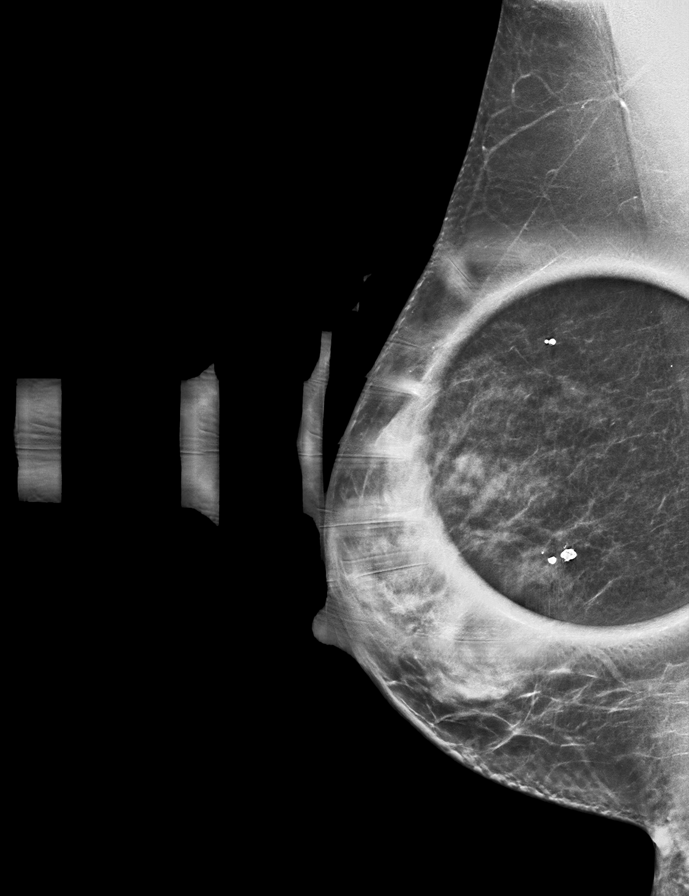

[R CC synth-2D (1 of 2)]
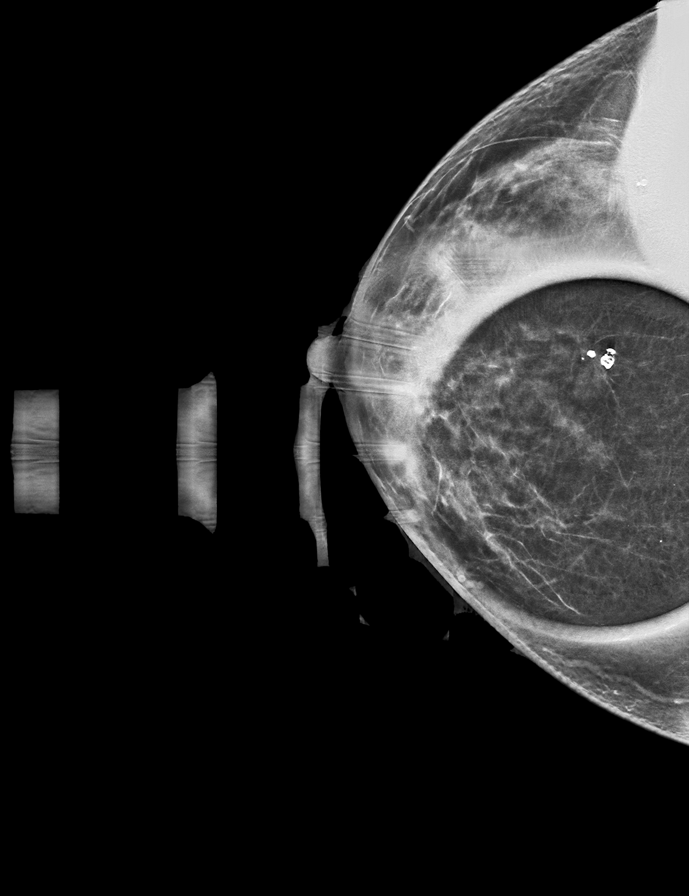

[R ML synth-2D]
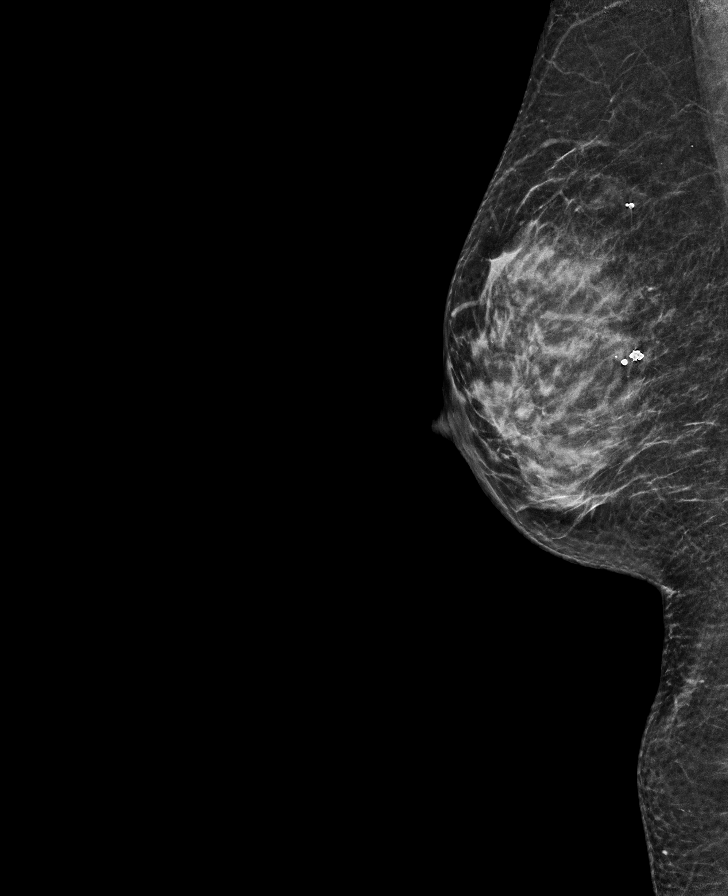

[R CC synth-2D (2 of 2)]
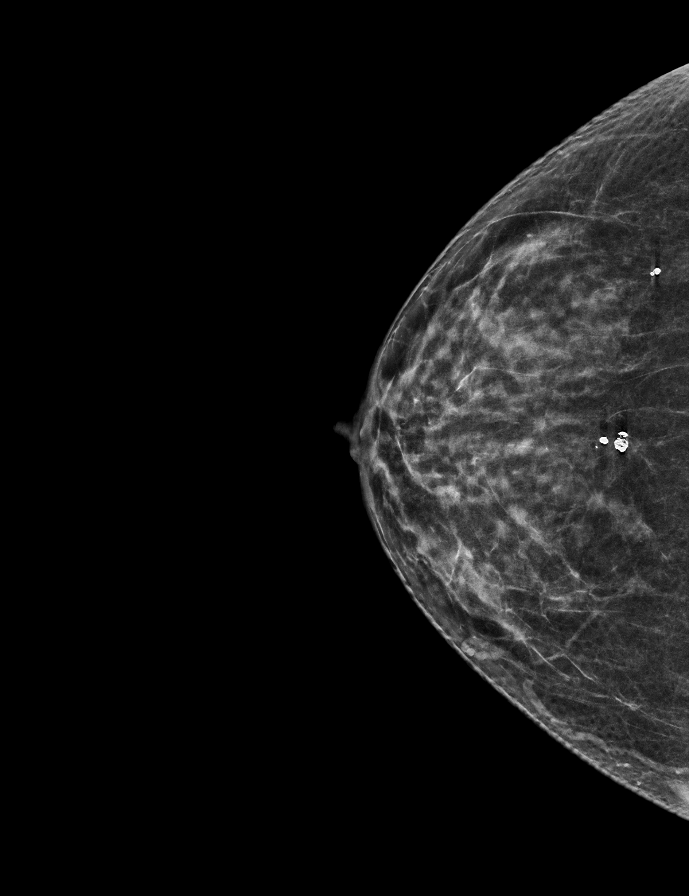

[L CC synth-2D]
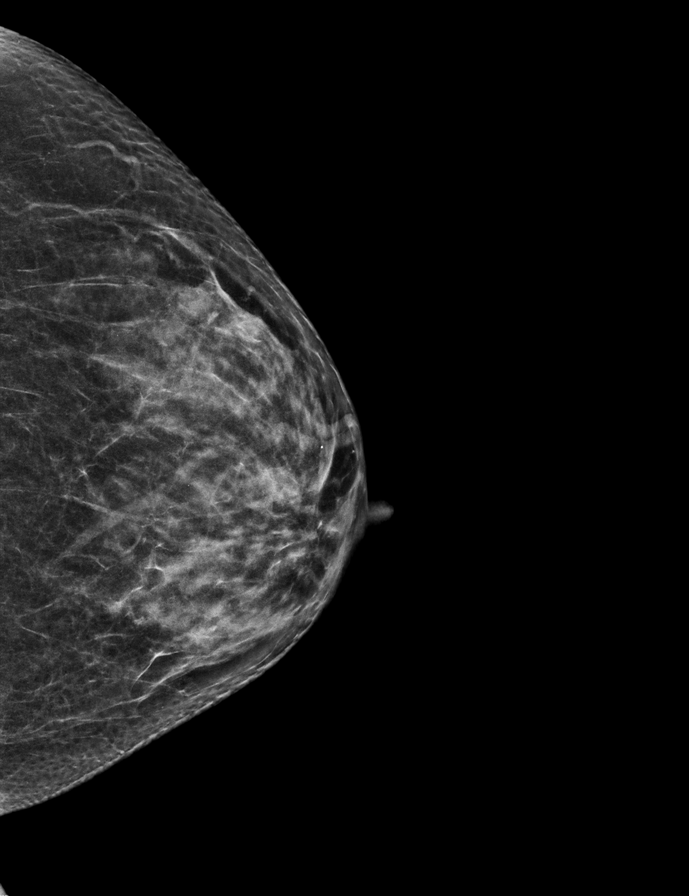

[R CC tomo · tomo slice 29/56.0]
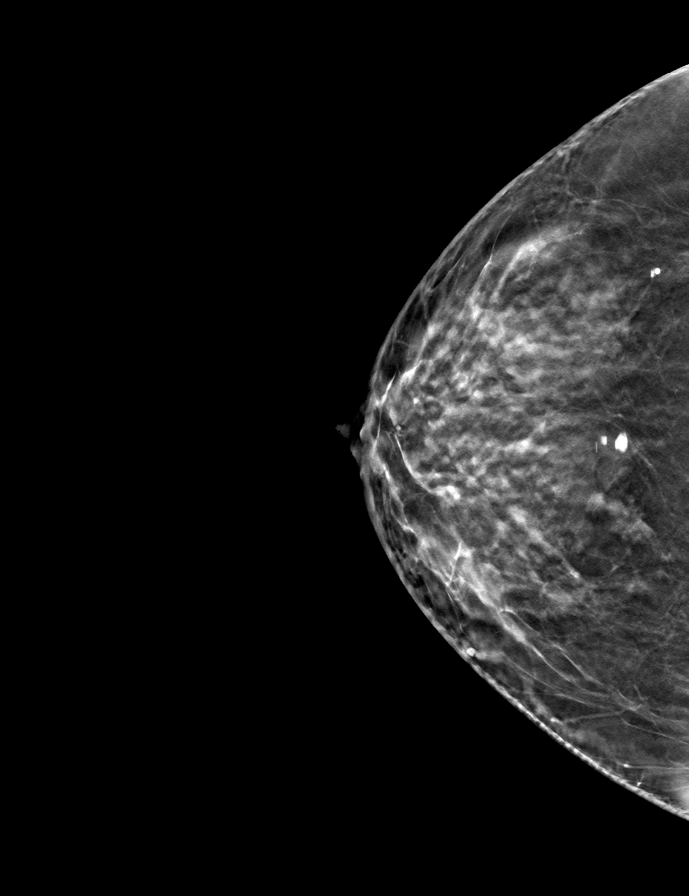

[8 of 40 positions shown; findings below may reference images not displayed]

ACR Breast Density Category c: The breast tissue is heterogeneously
dense, which may obscure small masses.
FINDINGS: No suspicious mass, microcalcification, or other finding is
identified in either breast.

Targeted right breast ultrasound was performed.

At 10 o'clock 3 cm from the nipple an oval circumscribed hypoechoic
mass measures 6 x 3 x 7 mm. This is not significantly changed since
08/13/2015 and therefore benign.

At 10 o'clock 1 cm from the nipple an oval circumscribed hypoechoic
mass measures 8 x 3 x 7 mm. This is not significantly changed since
08/13/2015 and therefore benign.
IMPRESSION: Benign masses in the right breast. No evidence of malignancy in
either breast.

RECOMMENDATION:
Recommend routine annual screening mammogram in 1 year.

I have discussed the findings and recommendations with the patient
via a Spanish language interpreter. If applicable, a reminder letter
will be sent to the patient regarding the next appointment.

BI-RADS CATEGORY  2: Benign.

## 2023-10-26 ENCOUNTER — Telehealth: Payer: Self-pay | Admitting: *Deleted

## 2023-10-26 ENCOUNTER — Encounter: Payer: Self-pay | Admitting: Family Medicine

## 2023-12-17 ENCOUNTER — Other Ambulatory Visit: Payer: Self-pay | Admitting: Family Medicine

## 2023-12-17 DIAGNOSIS — Z1231 Encounter for screening mammogram for malignant neoplasm of breast: Secondary | ICD-10-CM

## 2024-01-11 ENCOUNTER — Ambulatory Visit
Admission: RE | Admit: 2024-01-11 | Discharge: 2024-01-11 | Disposition: A | Discharge status: Home / Self Care | Payer: Self-pay | Source: Ambulatory Visit | Attending: Family Medicine | Admitting: Family Medicine

## 2024-01-11 DIAGNOSIS — Z1231 Encounter for screening mammogram for malignant neoplasm of breast: Secondary | ICD-10-CM | POA: Insufficient documentation

## 2024-01-14 ENCOUNTER — Other Ambulatory Visit: Payer: Self-pay | Admitting: Obstetrics and Gynecology

## 2024-01-14 DIAGNOSIS — R928 Other abnormal and inconclusive findings on diagnostic imaging of breast: Secondary | ICD-10-CM

## 2024-01-18 ENCOUNTER — Telehealth: Payer: Self-pay

## 2024-01-18 NOTE — Telephone Encounter (Signed)
 Per Bari Silversmith, B[facility]CP Scheduler- Per Nanetta, LPN at [facility], patient's pcp plans to repeat patient's pap smear and refer patient to [facility]CP if needed for colpo.

## 2024-01-19 ENCOUNTER — Ambulatory Visit
Admission: RE | Admit: 2024-01-19 | Discharge: 2024-01-19 | Disposition: A | Discharge status: Home / Self Care | Payer: Self-pay | Source: Ambulatory Visit | Attending: Obstetrics and Gynecology | Admitting: Obstetrics and Gynecology

## 2024-01-19 ENCOUNTER — Other Ambulatory Visit: Payer: Self-pay

## 2024-01-19 ENCOUNTER — Ambulatory Visit: Payer: Self-pay | Attending: Obstetrics and Gynecology | Admitting: *Deleted

## 2024-01-19 VITALS — BP 117/60 | Ht <= 58 in | Wt 153.0 lb

## 2024-01-19 DIAGNOSIS — R928 Other abnormal and inconclusive findings on diagnostic imaging of breast: Secondary | ICD-10-CM | POA: Insufficient documentation

## 2024-01-19 DIAGNOSIS — R87610 Atypical squamous cells of undetermined significance on cytologic smear of cervix (ASC-US): Secondary | ICD-10-CM

## 2024-01-19 DIAGNOSIS — N644 Mastodynia: Secondary | ICD-10-CM

## 2024-01-19 DIAGNOSIS — Z1211 Encounter for screening for malignant neoplasm of colon: Secondary | ICD-10-CM

## 2024-01-19 DIAGNOSIS — Z1239 Encounter for other screening for malignant neoplasm of breast: Secondary | ICD-10-CM

## 2024-01-19 NOTE — Patient Instructions (Signed)
 Explained breast self awareness with Sundra Thomasina Guan. Patient did not need a Pap smear today due to last Pap smear was 10/22/2023. Referred patient to the [facility] for a left breast diagnostic mammogram per recommendation. Appointment scheduled Tuesday, January 19, 2024 at 1520. Patient aware of appointment and will be there. Let patient know will follow up with her within the next couple weeks with results. Aerie Latoiya Maradiaga verbalized understanding.  Adonys Wildes, Wanda Ship, RN 2:07 PM

## 2024-01-19 NOTE — Progress Notes (Unsigned)
 [name] is a 43 y.o. female who presents to [facility] clinic today with complaint of left breast pain since screening mammogram was completed on 01/11/2024. Patient referred to [facility]CP due to having a screening mammogram completed 01/11/2024 and additional imaging of the left breast is recommended for follow up.    Pap Smear: Pap smear not completed today. Last Pap smear was 10/22/2023 at [facility] clinic and was abnormal - ASCUS with positive HPV. Patients prior Pap smear was 03/19/2020 that was normal with no HPV completed. Patient has history two other abnormal Pap smears 11/13/2014 that was ASCUS with HPV negative and 09/15/2013 that was LSIL with negative HPV  that no follow up was completed for either Pap smear. Last Pap smear result is available in Epic.   Physical exam: Breasts Left breast is larger than right breast that per patient is normal for her. No skin abnormalities bilateral breasts. No nipple retraction bilateral breasts. No nipple discharge bilateral breasts. No lymphadenopathy. No lumps palpated bilateral breasts. No complaints of pain or tenderness on exam.    MS 3D SCR MAMMO BILAT BR (aka MM) Result Date: 01/13/2024 CLINICAL DATA:  Screening. EXAM: DIGITAL SCREENING BILATERAL MAMMOGRAM WITH TOMOSYNTHESIS AND CAD TECHNIQUE: Bilateral screening digital craniocaudal and mediolateral oblique mammograms were obtained. Bilateral screening digital breast tomosynthesis was performed. The images were evaluated with computer-aided detection. COMPARISON:  Previous exam(s). ACR Breast Density Category c: The breasts are heterogeneously dense, which may obscure small masses. FINDINGS: In the left breast, a possible focal asymmetry warrants further evaluation. In the right breast, no findings suspicious for malignancy. IMPRESSION: Further evaluation is suggested for possible asymmetry in the left breast. RECOMMENDATION: Diagnostic mammogram and possibly ultrasound of the left breast.  (Code:FI-L-24M) The patient will be contacted regarding the findings, and additional imaging will be scheduled. BI-RADS CATEGORY  0: Incomplete: Need additional imaging evaluation. Electronically Signed   By: Norleen Croak M.D.   On: 01/13/2024 16:27   MS DIGITAL DIAG TOMO BILAT Result Date: 09/11/2021 CLINICAL DATA:  Here for follow-up of probably benign masses in the right breast as well as annual mammogram of the left breast. EXAM: DIGITAL DIAGNOSTIC BILATERAL MAMMOGRAM WITH TOMOSYNTHESIS AND CAD; ULTRASOUND RIGHT BREAST LIMITED TECHNIQUE: Bilateral digital diagnostic mammography and breast tomosynthesis was performed. The images were evaluated with computer-aided detection.; Targeted ultrasound examination of the right breast was performed COMPARISON:  Previous exam(s). ACR Breast Density Category c: The breast tissue is heterogeneously dense, which may obscure small masses. FINDINGS: No suspicious mass, microcalcification, or other finding is identified in either breast. Targeted right breast ultrasound was performed. At 10 o'clock 3 cm from the nipple an oval circumscribed hypoechoic mass measures 6 x 3 x 7 mm. This is not significantly changed since 08/13/2015 and therefore benign. At 10 o'clock 1 cm from the nipple an oval circumscribed hypoechoic mass measures 8 x 3 x 7 mm. This is not significantly changed since 08/13/2015 and therefore benign. IMPRESSION: Benign masses in the right breast. No evidence of malignancy in either breast. RECOMMENDATION: Recommend routine annual screening mammogram in 1 year. I have discussed the findings and recommendations with the patient via a Spanish language interpreter. If applicable, a reminder letter will be sent to the patient regarding the next appointment. BI-RADS CATEGORY  2: Benign. Electronically Signed   By: Norman Hopper M.D.   On: 09/11/2021 15:41  Pelvic/Bimanual Pap is not indicated today per B[facility]CP guidelines.   Smoking History: Patient has never  smoked.  Patient Navigation: Patient education provided. Access to services provided for patient through [employer] program. Spanish interpreter Damon Pierce from [facility] provided.   Colorectal Cancer Screening: Per patient has never had colonoscopy completed. Patient declined the FIT test. No complaints today.    Breast and Cervical Cancer Risk Assessment: Patient does not have family history of breast cancer, known genetic mutations, or radiation treatment to the chest before age 42. Patient does not have history of cervical dysplasia, immunocompromised, or DES exposure in-utero.  Risk Scores as of Encounter on 01/19/2024     Alisa           5-year 0.62%   Lifetime 8.51%            Last calculated by Silas, Ansyi K, CMA on 01/19/2024 at  2:03 PM        A: B[facility]CP exam without pap smear Complaint of left breast pain.  P: Referred patient to the [facility] for a left breast diagnostic mammogram per recommendation. Appointment scheduled Tuesday, January 19, 2024 at 1520.  Referred patient to [facility] for a colposcopy to follow up for her abnormal Pap smear. Bari will call patient with appointment after confirmed.  Driscilla Wanda SQUIBB, RN 01/19/2024 2:06 PM

## 2024-01-21 ENCOUNTER — Telehealth: Payer: Self-pay

## 2024-02-01 ENCOUNTER — Telehealth: Payer: Self-pay

## 2024-02-01 NOTE — Telephone Encounter (Signed)
 Called patient to schedule colposcopy. Patient stated she already has an appointment with [facility]. She will reach out to her PCP to get clarification and call back if needed.

## 2024-03-08 ENCOUNTER — Other Ambulatory Visit (HOSPITAL_COMMUNITY)
Admission: RE | Admit: 2024-03-08 | Discharge: 2024-03-08 | Disposition: A | Discharge status: Home / Self Care | Payer: Self-pay | Source: Ambulatory Visit | Attending: Obstetrics & Gynecology | Admitting: Obstetrics & Gynecology

## 2024-03-08 ENCOUNTER — Encounter: Payer: Self-pay | Admitting: Obstetrics & Gynecology

## 2024-03-08 ENCOUNTER — Ambulatory Visit (INDEPENDENT_AMBULATORY_CARE_PROVIDER_SITE_OTHER): Payer: Self-pay | Admitting: Obstetrics & Gynecology

## 2024-03-08 VITALS — BP 116/74 | HR 92 | Ht <= 58 in | Wt 146.0 lb

## 2024-03-08 DIAGNOSIS — N870 Mild cervical dysplasia: Secondary | ICD-10-CM

## 2024-03-08 DIAGNOSIS — R87810 Cervical high risk human papillomavirus (HPV) DNA test positive: Secondary | ICD-10-CM | POA: Insufficient documentation

## 2024-03-08 DIAGNOSIS — Z758 Other problems related to medical facilities and other health care: Secondary | ICD-10-CM

## 2024-03-08 DIAGNOSIS — Z3202 Encounter for pregnancy test, result negative: Secondary | ICD-10-CM

## 2024-03-08 DIAGNOSIS — R87610 Atypical squamous cells of undetermined significance on cytologic smear of cervix (ASC-US): Secondary | ICD-10-CM | POA: Insufficient documentation

## 2024-03-08 DIAGNOSIS — Z01812 Encounter for preprocedural laboratory examination: Secondary | ICD-10-CM

## 2024-03-08 LAB — POCT URINE PREGNANCY: Preg Test, Ur: NEGATIVE

## 2024-03-08 NOTE — Progress Notes (Signed)
    GYNECOLOGY PROGRESS NOTE  Subjective:    Patient ID: Christina Conrad, female    DOB: 1980/09/07, 43 y.o.   MRN: [id number]  HPI  Patient is a 43 y.o. G2P2 referred from [facility] for a ASCUS + HR HPV pap this year. She has a h/o LGSIL about 8 years ago.  The following portions of the patient's history were reviewed and updated as appropriate: allergies, current medications, past family history, past medical history, past social history, past surgical history, and problem list.  Review of Systems Pertinent items are noted in HPI.   Objective:   Blood pressure 116/74, pulse 92, height 4' 10 (1.473 m), weight 146 lb (66.2 kg). Body mass index is 30.51 kg/m. Interpretor present Well nourished, well hydrated Latina, no apparent distress  UPT negative, consent signed, time out done Speculum placed. Cervix prepped with acetic acid. Transformation zone seen in its entirety. Colpo adequate. Colposcopic findings normal. ECC obtained. She tolerated the procedure well.   Assessment:   1. ASCUS with positive high risk HPV cervical     2.     Language barrier  Plan:   1. ASCUS with positive high risk HPV cervical (Primary)

## 2024-03-11 LAB — SURGICAL PATHOLOGY

## 2024-03-14 ENCOUNTER — Telehealth: Payer: Self-pay

## 2024-03-14 NOTE — Telephone Encounter (Signed)
 Patient aware. Please call patient to schedule LEEP.

## 2024-03-14 NOTE — Telephone Encounter (Signed)
-----   Message from Harland JAYSON Birkenhead sent at 03/14/2024  8:40 AM EST ----- Hi and Happy Monday! Can you call this lady and explain that she will need a LEEP procedure because the ECC cells were abnormal. Thanks

## 2024-03-23 ENCOUNTER — Telehealth: Payer: Self-pay

## 2024-03-23 NOTE — Telephone Encounter (Signed)
 Pt son called with his mother to understand her colpo results and also wants to let the dr know she has some vaginal burning pain. Pts son is aware the next step from the colpo is to schedule a LEEP.

## 2024-03-25 NOTE — Telephone Encounter (Signed)
 Tried again, LEEP scheduled for 04/26/24 at 1:35 pm.

## 2024-04-26 ENCOUNTER — Ambulatory Visit (INDEPENDENT_AMBULATORY_CARE_PROVIDER_SITE_OTHER): Payer: Self-pay | Admitting: Obstetrics & Gynecology

## 2024-04-26 ENCOUNTER — Other Ambulatory Visit (HOSPITAL_COMMUNITY)
Admission: RE | Admit: 2024-04-26 | Discharge: 2024-04-26 | Disposition: A | Discharge status: Home / Self Care | Payer: Self-pay | Source: Ambulatory Visit | Attending: Obstetrics & Gynecology | Admitting: Obstetrics & Gynecology

## 2024-04-26 VITALS — BP 143/84 | HR 88 | Ht <= 58 in | Wt 147.5 lb

## 2024-04-26 DIAGNOSIS — N870 Mild cervical dysplasia: Secondary | ICD-10-CM | POA: Insufficient documentation

## 2024-04-26 DIAGNOSIS — Z758 Other problems related to medical facilities and other health care: Secondary | ICD-10-CM

## 2024-04-26 NOTE — Progress Notes (Signed)
" ° ° °  GYNECOLOGY PROGRESS NOTE  Subjective:    Patient ID: Christina Conrad, female    DOB: 02-09-1981, 44 y.o.   MRN: [id number]  HPI  Patient is a 44 y.o. G2P2 here for a LEEP due to CIN 1 being seen on ECC at time of colpo 02/2024. She initially was referred from [facility] for ASCUS + HR HPV pap. She had a h/o a LGSIL pap about 8 years ago.  The following portions of the patient's history were reviewed and updated as appropriate: allergies, current medications, past family history, past medical history, past social history, past surgical history, and problem list.  Review of Systems Pertinent items are noted in HPI.   Objective:   There were no vitals taken for this visit. There is no height or weight on file to calculate BMI. Live interpretor present for exam  Colpo Biopsy:   Risks, benefits, alternatives, and limitations of procedure explained to patient, including pain, bleeding, infection, failure to remove abnormal tissue and failure to cure dysplasia, need for repeat procedures, damage to pelvic organs, cervical incompetence.  Role of HPV,cervical dysplasia and need for close followup was empasized. Informed written consent was obtained. All questions were answered. Time out performed. Urine pregnancy test was negative.  ??Procedure: The patient was placed in lithotomy position and the bivalved coated speculum was placed in the patient's vagina. A grounding pad placed on the patient. Acetic acid was applied to the cervix and all appeared normal. I sprayed Hurricaine spray.  Local anesthesia was administered via an intracervical block using 15cc of 2% Lidocaine with epinephrine. The suction was turned on and the medium apple-core LEEP tip on 50 Watts of cutting current and cautery was used to excise the entire transformation zone and any areas of visible dysplasia. I obtained an ECC.  Excellent hemostasis was achieved using roller ball coagulation set at 50 Watts coagulation  current. As per my usual, I coated the cone bed with Monsel's. The speculum was removed from the vagina. Specimens were sent to pathology.  ?The patient tolerated the procedure well. Post-operative instructions given to patient, including instruction to seek medical attention for persistent bright red bleeding, fever, abdominal/pelvic pain, dysuria, nausea or vomiting. She was also told about the possibility of having copious yellow to black tinged discharge for weeks. She was counseled to avoid anything in the vagina (sex/douching/tampons) for 3 weeks.    Assessment:   1. Dysplasia of cervix, low grade (CIN 1)      Plan:   1. Dysplasia of cervix, low grade (CIN 1) (Primary) Await pathology She will receive a call on Monday with results   "

## 2024-04-26 NOTE — Addendum Note (Signed)
 Addended by: Zana Biancardi on: 04/26/2024 03:08 PM   Modules accepted: Orders

## 2024-04-28 LAB — SURGICAL PATHOLOGY

## 2024-05-10 ENCOUNTER — Ambulatory Visit
Admission: EM | Admit: 2024-05-10 | Discharge: 2024-05-10 | Disposition: A | Discharge status: Home / Self Care | Payer: Self-pay | Attending: Emergency Medicine | Admitting: Emergency Medicine

## 2024-05-10 ENCOUNTER — Telehealth: Payer: Self-pay

## 2024-05-10 DIAGNOSIS — H04123 Dry eye syndrome of bilateral lacrimal glands: Secondary | ICD-10-CM

## 2024-05-10 MED ORDER — CARBOXYMETHYLCELLUL-GLYCERIN 0.5-0.9 % OP SOLN: 1.0000 [drp] | Freq: Three times a day (TID) | OPHTHALMIC | 0 refills | Status: AC | PRN

## 2024-05-10 NOTE — Discharge Instructions (Addendum)
 Use eye drops as directed Follow up with PCP If symptoms persist or do not improve follow up with [facility]-call for appt  Use gotas para los ojos segn las indicaciones. Siga con su mdico de atencin primaria. Si los sntomas persisten o no mejoran, haga seguimiento con el [facility] [place] - llame para una cita.

## 2024-05-10 NOTE — ED Triage Notes (Signed)
 Patient to Urgent Care with complaints of bilateral eye irritation x5 days. Dryness. Denies any drainage.

## 2024-05-10 NOTE — Telephone Encounter (Signed)
-----   Message from Harland Birkenhead, MD sent at 05/10/2024  8:45 AM EST ----- She will need a pap smear again in 3 months. Thanks

## 2024-05-10 NOTE — ED Provider Notes (Signed)
 " [facility]    CSN: [id number] Arrival date & time: 05/10/24  1517      History   Chief Complaint Chief Complaint  Patient presents with   Eye Problem    HPI Khamil Aracelly Tencza is a 44 y.o. female.   Sundra Thomasina Guan, 44 year old female, presents to urgent care for evaluation of bilateral eye irritation/burning for 5 days. Treating with eye drops from [facility], pt denies drainage from eyes, pt denies visual changes  PMH: prediabetes   The history is provided by the patient. A language interpreter was used [place] [phone]).    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 Guinevere office.   BREAST SURGERY Bilateral 1.16.2014   Bard core bx done left breast 2 o'clock, right breast 1100 and 2 o'clock. fibroadenoma and PASH   CESAREAN SECTION  2009    OB History     Gravida  2   Para  2   Term      Preterm      AB      Living  2      SAB      IAB      Ectopic      Multiple      Live Births           Obstetric Comments  Age with first menstruation-8 Age with first pregnancy-17          Home Medications    Prior to Admission medications  Medication Sig Start Date End Date Taking? Authorizing Provider  carboxymethylcellul-glycerin  (REFRESH OPTIVE) 0.5-0.9 % ophthalmic solution Place 1 drop into both eyes 3 (three) times daily as needed for dry eyes. 05/10/24  Yes Malaisha Silliman, NP  albuterol (VENTOLIN HFA) 108 (90 Base) MCG/ACT inhaler Inhale 2 puffs into the lungs every 4 (four) hours as  needed. Patient not taking: Reported on 04/26/2024 04/11/21   [provider]  Calcium Carbonate Antacid (TUMS PO) Take 500 mg by mouth.    [provider]  clobetasol ointment (TEMOVATE) 0.05 % CLOBETASOL PROPIONATE 0.05 % OINT Patient not taking: Reported on 04/26/2024 12/17/23   [provider]  escitalopram (LEXAPRO) 10 MG tablet Take 10 mg by mouth daily. Patient not taking: Reported on 04/26/2024 09/03/21   [provider]  pantoprazole (PROTONIX) 40 MG tablet Take 40 mg by mouth daily. 09/03/21   [provider]    Family History Family History  Problem Relation Age of Onset   Cancer Father    Breast cancer Paternal Grandmother     Social History Social History[1]   Allergies   Diclofenac and Shellfish allergy   Review of Systems Review of Systems  Constitutional:  Negative for fever.  Eyes:  Negative for photophobia, pain, discharge, redness, itching and visual disturbance.       Eye burning  All other systems reviewed and are negative.    Physical Exam Triage Vital Signs ED Triage Vitals  Encounter Vitals Group     BP 05/10/24 1550 120/83  Girls Systolic BP Percentile --      Girls Diastolic BP Percentile --      Boys Systolic BP Percentile --      Boys Diastolic BP Percentile --      Pulse Rate 05/10/24 1550 84     Resp 05/10/24 1550 19     Temp 05/10/24 1550 98.8 F (37.1 C)     Temp src --      SpO2 05/10/24 1550 98 %     Weight 05/10/24 1548 147 lb 3.2 oz (66.8 kg)     Height --      Head Circumference --      Peak Flow --      Pain Score 05/10/24 1549 0     Pain Loc --      Pain Education --      Exclude from Growth Chart --    No data found.  Updated Vital Signs BP 120/83   Pulse 84   Temp 98.8 F (37.1 C)   Resp 19   Wt 147 lb 3.2 oz (66.8 kg)   LMP 05/08/2024   SpO2 98%   BMI 30.76 kg/m   Visual Acuity Right Eye Distance:   Left Eye Distance:   Bilateral Distance:    Right Eye Near:    Left Eye Near:    Bilateral Near:     Physical Exam Vitals and nursing note reviewed.  Constitutional:      General: She is not in acute distress.    Appearance: She is well-developed and well-groomed.  HENT:     Head: Normocephalic and atraumatic.  Eyes:     Conjunctiva/sclera: Conjunctivae normal.  Cardiovascular:     Rate and Rhythm: Normal rate and regular rhythm.     Heart sounds: Normal heart sounds. No murmur heard. Pulmonary:     Effort: Pulmonary effort is normal. No respiratory distress.     Breath sounds: Normal breath sounds and air entry.  Abdominal:     Palpations: Abdomen is soft.     Tenderness: There is no abdominal tenderness.  Musculoskeletal:        General: No swelling.     Cervical back: Neck supple.  Skin:    General: Skin is warm and dry.     Capillary Refill: Capillary refill takes less than 2 seconds.  Neurological:     General: No focal deficit present.     Mental Status: She is alert and oriented to person, place, and time.  Psychiatric:        Attention and Perception: Attention normal.        Mood and Affect: Mood normal.        Speech: Speech normal.        Behavior: Behavior normal. Behavior is cooperative.      UC Treatments / Results  Labs (all labs ordered are listed, but only abnormal results are displayed) Labs Reviewed - No data to display  EKG   Radiology No results found.  Procedures Procedures (including critical care time)  Medications Ordered in UC Medications - No data to display  Initial Impression / Assessment and Plan / UC Course  I have reviewed the triage vital signs and the nursing notes.  Pertinent labs & imaging results that were available during my care of the patient were reviewed by me and considered in my medical decision making (see chart for details).     Discussed exam findings and plan of care with pt via translator: Discussed  exam findings and plan of care with pt: Use eye drops as  directed Follow up with PCP If symptoms persist or do not improve follow up with [facility]-call for appt  Use gotas para los ojos segn las indicaciones. Siga con su mdico de atencin primaria. Si los sntomas persisten o no mejoran, haga seguimiento con el centro de ojos [place] - llame para una cita. Pt verbalized understanding to this provider via translator.  Ddx: Dry eyes,allergies, viral illness, conjunctivitis Final Clinical Impressions(s) / UC Diagnoses   Final diagnoses:  Bilateral dry eyes     Discharge Instructions      Use eye drops as directed Follow up with PCP If symptoms persist or do not improve follow up with [facility]-call for appt  Use gotas para los ojos segn las indicaciones. Siga con su mdico de atencin primaria. Si los sntomas persisten o no mejoran, haga seguimiento con el centro de ojos  - llame para una cita.     ED Prescriptions     Medication Sig Dispense Auth. Provider   carboxymethylcellul-glycerin  (REFRESH OPTIVE) 0.5-0.9 % ophthalmic solution Place 1 drop into both eyes 3 (three) times daily as needed for dry eyes. 15 mL Malaney Mcbean, Rilla, NP      PDMP not reviewed this encounter.     [1]  Social History Tobacco Use   Smoking status: Never   Smokeless tobacco: Never  Substance Use Topics   Alcohol use: No   Drug use: No     Elyanah Farino, Rilla, NP 05/10/24 1802  "

## 2024-05-10 NOTE — Telephone Encounter (Signed)
 Patient aware

## 2024-05-23 ENCOUNTER — Ambulatory Visit: Payer: Self-pay | Admitting: Obstetrics & Gynecology

## 2024-05-24 ENCOUNTER — Ambulatory Visit: Payer: Self-pay | Admitting: Obstetrics & Gynecology

## 2024-06-06 ENCOUNTER — Ambulatory Visit: Payer: Self-pay | Admitting: Obstetrics & Gynecology
